# Patient Record
Sex: Male | Born: 1945 | Race: White | Hispanic: No | Marital: Married | Smoking: Former smoker
Health system: Southern US, Community
[De-identification: ages and names within clinical notes are randomized; demographics above are authoritative.]

## PROBLEM LIST (undated history)

## (undated) DIAGNOSIS — Z944 Liver transplant status: Secondary | ICD-10-CM

## (undated) DIAGNOSIS — E119 Type 2 diabetes mellitus without complications: Secondary | ICD-10-CM

## (undated) DIAGNOSIS — I10 Essential (primary) hypertension: Secondary | ICD-10-CM

## (undated) DIAGNOSIS — J449 Chronic obstructive pulmonary disease, unspecified: Secondary | ICD-10-CM

## (undated) HISTORY — PX: LIVER TRANSPLANT: SHX410

## (undated) HISTORY — PX: HERNIA REPAIR: SHX51

---

## 2005-02-25 ENCOUNTER — Emergency Department: Payer: Self-pay | Admitting: Emergency Medicine

## 2008-09-14 ENCOUNTER — Emergency Department: Payer: Self-pay | Admitting: Emergency Medicine

## 2008-09-24 ENCOUNTER — Emergency Department: Payer: Self-pay | Admitting: Emergency Medicine

## 2010-02-22 ENCOUNTER — Ambulatory Visit: Payer: Self-pay | Admitting: Internal Medicine

## 2011-12-01 ENCOUNTER — Ambulatory Visit: Payer: Self-pay | Admitting: Internal Medicine

## 2013-09-03 ENCOUNTER — Ambulatory Visit: Payer: Self-pay | Admitting: Internal Medicine

## 2017-08-29 ENCOUNTER — Ambulatory Visit
Admission: RE | Admit: 2017-08-29 | Discharge: 2017-08-29 | Disposition: A | Discharge status: Home / Self Care | Payer: Medicare Other | Source: Ambulatory Visit | Attending: Internal Medicine | Admitting: Internal Medicine

## 2017-08-29 ENCOUNTER — Other Ambulatory Visit: Payer: Self-pay | Admitting: Internal Medicine

## 2017-08-29 DIAGNOSIS — M5489 Other dorsalgia: Secondary | ICD-10-CM

## 2017-08-29 DIAGNOSIS — M47816 Spondylosis without myelopathy or radiculopathy, lumbar region: Secondary | ICD-10-CM | POA: Insufficient documentation

## 2018-06-15 ENCOUNTER — Encounter: Payer: Self-pay | Admitting: Emergency Medicine

## 2018-06-15 ENCOUNTER — Other Ambulatory Visit: Payer: Self-pay

## 2018-06-15 ENCOUNTER — Emergency Department: Payer: Medicare Other

## 2018-06-15 ENCOUNTER — Emergency Department
Admission: EM | Admit: 2018-06-15 | Discharge: 2018-06-16 | Disposition: A | Discharge status: Home / Self Care | Payer: Medicare Other | Attending: Emergency Medicine | Admitting: Emergency Medicine

## 2018-06-15 DIAGNOSIS — S6992XA Unspecified injury of left wrist, hand and finger(s), initial encounter: Secondary | ICD-10-CM | POA: Diagnosis present

## 2018-06-15 DIAGNOSIS — Y999 Unspecified external cause status: Secondary | ICD-10-CM | POA: Insufficient documentation

## 2018-06-15 DIAGNOSIS — I10 Essential (primary) hypertension: Secondary | ICD-10-CM | POA: Diagnosis not present

## 2018-06-15 DIAGNOSIS — Y929 Unspecified place or not applicable: Secondary | ICD-10-CM | POA: Insufficient documentation

## 2018-06-15 DIAGNOSIS — R51 Headache: Secondary | ICD-10-CM | POA: Diagnosis not present

## 2018-06-15 DIAGNOSIS — W1789XA Other fall from one level to another, initial encounter: Secondary | ICD-10-CM | POA: Insufficient documentation

## 2018-06-15 DIAGNOSIS — S0990XA Unspecified injury of head, initial encounter: Secondary | ICD-10-CM

## 2018-06-15 DIAGNOSIS — E119 Type 2 diabetes mellitus without complications: Secondary | ICD-10-CM | POA: Diagnosis not present

## 2018-06-15 DIAGNOSIS — J449 Chronic obstructive pulmonary disease, unspecified: Secondary | ICD-10-CM | POA: Diagnosis not present

## 2018-06-15 DIAGNOSIS — Z87891 Personal history of nicotine dependence: Secondary | ICD-10-CM | POA: Diagnosis not present

## 2018-06-15 DIAGNOSIS — S52552A Other extraarticular fracture of lower end of left radius, initial encounter for closed fracture: Secondary | ICD-10-CM | POA: Diagnosis not present

## 2018-06-15 DIAGNOSIS — Y939 Activity, unspecified: Secondary | ICD-10-CM | POA: Diagnosis not present

## 2018-06-15 DIAGNOSIS — Z7901 Long term (current) use of anticoagulants: Secondary | ICD-10-CM | POA: Insufficient documentation

## 2018-06-15 HISTORY — DX: Chronic obstructive pulmonary disease, unspecified: J44.9

## 2018-06-15 HISTORY — DX: Essential (primary) hypertension: I10

## 2018-06-15 HISTORY — DX: Liver transplant status: Z94.4

## 2018-06-15 HISTORY — DX: Type 2 diabetes mellitus without complications: E11.9

## 2018-06-15 MED ORDER — METOCLOPRAMIDE HCL 10 MG PO TABS
10.0000 mg | ORAL_TABLET | Freq: Once | ORAL | Status: AC
  Administered 2018-06-15: 10 mg via ORAL
  Filled 2018-06-15: qty 1

## 2018-06-15 MED ORDER — OXYCODONE-ACETAMINOPHEN 5-325 MG PO TABS
1.0000 | ORAL_TABLET | Freq: Once | ORAL | Status: AC
  Administered 2018-06-15: 1 via ORAL
  Filled 2018-06-15: qty 1

## 2018-06-15 NOTE — ED Triage Notes (Signed)
Pt arrived after mechanical fall that caused pt to hit his head. Pt takes coumadin. Pt denies loc. Pt reports tailbone pain and headache.

## 2018-06-15 NOTE — ED Provider Notes (Signed)
[facility] Emergency Department Provider Note ____________________________________________   First MD Initiated Contact with Patient 06/15/18 2121     (approximate)  I have reviewed the triage vital signs and the nursing notes.   HISTORY  Chief Complaint Fall    HPI Joshua Marshall is a 72 y.o. male with PMH as noted below and who is on Coumadin presents with a head injury, acute onset shortly before coming to the hospital when he tripped and fell from standing height, not associated with LOC.  Patient does report some nausea and headache.  He also reports some neck pain, pain to the tailbone, and to the left wrist and hand.   Past Medical History:  Diagnosis Date  . COPD (chronic obstructive pulmonary disease) (HCC)   . Diabetes mellitus without complication (HCC)   . Hypertension   . Liver transplanted (HCC)     There are no active problems to display for this patient.   Past Surgical History:  Procedure Laterality Date  . LIVER TRANSPLANT      Prior to Admission medications   Not on File    Allergies Patient has no allergy information on record.  No family history on file.  Social History Social History   Tobacco Use  . Smoking status: Former Games developer  . Smokeless tobacco: Never Used  Substance Use Topics  . Alcohol use: Not on file  . Drug use: Not on file    Review of Systems  Constitutional: No weakness. Eyes: No eye injury. ENT: Positive for neck pain. Cardiovascular: Denies chest pain. Respiratory: Denies shortness of breath. Gastrointestinal: Positive for nausea.  Genitourinary: Negative for flank pain.  Musculoskeletal: Negative for back pain.  Positive for sacral and left wrist pain. Skin: Negative for rash. Neurological: Positive for headache.  Negative for focal weakness or numbness.   ____________________________________________   PHYSICAL EXAM:  VITAL SIGNS: ED Triage Vitals  Enc Vitals Group   BP 06/15/18 2105 132/77     Pulse Rate 06/15/18 2105 62     Resp 06/15/18 2105 18     Temp 06/15/18 2105 97.8 F (36.6 C)     Temp Source 06/15/18 2105 Oral     SpO2 06/15/18 2105 96 %     Weight 06/15/18 2106 227 lb 8 oz (103.2 kg)     Height 06/15/18 2106 5' 10.5" (1.791 m)     Head Circumference --      Peak Flow --      Pain Score 06/15/18 2106 8     Pain Loc --      Pain Edu? --      Excl. in GC? --     Constitutional: Alert and oriented.  Slightly uncomfortable but otherwise relatively well-appearing. Eyes: Conjunctivae are normal.  EOMI.  PERRLA. Head: Atraumatic. Nose: No congestion/rhinnorhea. Mouth/Throat: Mucous membranes are moist.   Neck: Normal range of motion.  Mild midline tenderness with no step-off or crepitus. Cardiovascular:  Good peripheral circulation. Respiratory: Normal respiratory effort.  Gastrointestinal: Soft and nontender. No distention.  Genitourinary: No flank tenderness. Musculoskeletal: Extremities warm and well perfused.  Left wrist tenderness with no deformity.  Intact distal pulses to all extremities.  No thoracic or lumbar spinal tenderness.  Mild coccygeal tenderness. Neurologic:  Normal speech and language.  Motor and sensory intact in all extremities.  Normal coordination.  No gross focal neurologic deficits are appreciated.  Skin:  Skin is warm and dry. No rash noted. Psychiatric: Mood and affect are normal.  Speech and behavior are normal.  ____________________________________________   LABS (all labs ordered are listed, but only abnormal results are displayed)  Labs Reviewed - No data to display ____________________________________________  EKG   ____________________________________________  RADIOLOGY  CT head: No ICH CT cervical spine: No acute fracture XR sacrum/coccyx: No acute fracture XR L hand: No acute fracture XR L wrist: Impacted distal radius fracture with no significant angulation or  displacement  ____________________________________________   PROCEDURES  Procedure(s) performed: No  Procedures  Critical Care performed: No ____________________________________________   INITIAL IMPRESSION / ASSESSMENT AND PLAN / ED COURSE  Pertinent labs & imaging results that were available during my care of the patient were reviewed by me and considered in my medical decision making (see chart for details).  72 year old male on Coumadin presents with head injury as well as left wrist and hand and coccyx area pain after a fall from standing height which was mechanical.  On exam the patient is uncomfortable but relatively well-appearing.  His vital signs are normal.  The remainder of the exam is as described above.  He has no lumbar or thoracic spinal tenderness, no chest or abdominal tenderness, and his neuro exam is nonfocal.  We will obtain imaging of the relevant areas and reassess  ----------------------------------------- 12:11 AM on 06/16/2018 -----------------------------------------  All the imaging is negative except for impacted left distal radius fracture with no significant angulation or displacement.  I consulted Dr. Rosita Kea from orthopedics over the phone who recommended splinting and he would advise the patient to follow-up next week.  I discussed the results of the work-up with the patient.  Will place a splint on the left wrist.  I counseled him on the other imaging.  I will prescribe pain medication and some antiemetic.  Return precautions given, and he and his wife expressed understanding.  ____________________________________________   FINAL CLINICAL IMPRESSION(S) / ED DIAGNOSES  Final diagnoses:  Other closed extra-articular fracture of distal end of left radius, initial encounter  Minor head injury, initial encounter      NEW MEDICATIONS STARTED DURING THIS VISIT:  New Prescriptions   No medications on file     Note:  This document was  prepared using Dragon voice recognition software and may include unintentional dictation errors.    Dionne Bucy, MD 06/16/18 [phone]

## 2018-06-16 MED ORDER — OXYCODONE-ACETAMINOPHEN 5-325 MG PO TABS: 1.0000 | ORAL_TABLET | Freq: Four times a day (QID) | ORAL | 0 refills | Status: AC | PRN

## 2018-06-16 MED ORDER — METOCLOPRAMIDE HCL 10 MG PO TABS: 10.0000 mg | ORAL_TABLET | Freq: Three times a day (TID) | ORAL | 0 refills | Status: AC | PRN

## 2018-06-16 NOTE — Discharge Instructions (Signed)
Call Dr. Neomia Glass office or your own hand surgeon on Monday to schedule an appointment within the next week.  Return to the ER for new, worsening, persistent severe pain, weakness or numbness, severe headaches, vomiting, or any other new or worsening symptoms that concern you.

## 2018-06-16 NOTE — ED Notes (Signed)
E signature pad not working in room . Pt verbalized understanding of DC.  

## 2018-08-13 ENCOUNTER — Ambulatory Visit: Payer: Medicare Other | Attending: Orthopedic Surgery | Admitting: Occupational Therapy

## 2018-08-13 ENCOUNTER — Other Ambulatory Visit: Payer: Self-pay

## 2018-08-13 DIAGNOSIS — M6281 Muscle weakness (generalized): Secondary | ICD-10-CM | POA: Diagnosis not present

## 2018-08-13 NOTE — Patient Instructions (Signed)
Pt to use 16oz hammer for wrist in all planes  15 reps  2 x day  Increase to 2 sets in 3 days and 3 sets in 6 days - if not increase pain  But keep pain with HEP under 1-2/10   Putty green med - for gripping - 2 x 15 reps  and then in week add firm dark blue to green -and same reps and sets

## 2018-08-13 NOTE — Therapy (Signed)
[facility] [address] [place], [place], [address] Phone: [phone]   Fax:  [phone]  Occupational Therapy Treatment  Patient Details  Name: Joshua Marshall MRN: [id number] Date of Birth: 08/14/1946 Referring Provider (OT): patterson   Encounter Date: 08/13/2018  OT End of Session - 08/13/18 1358    Visit Number  1    Number of Visits  1    Date for OT Re-Evaluation  08/13/18    OT Start Time  1302    OT Stop Time  1352    OT Time Calculation (min)  50 min    Activity Tolerance  Patient tolerated treatment well    Behavior During Therapy  [facility] for tasks assessed/performed       Past Medical History:  Diagnosis Date  . COPD (chronic obstructive pulmonary disease) (HCC)   . Diabetes mellitus without complication (HCC)   . Hypertension   . Liver transplanted [facility](HCC)     Past Surgical History:  Procedure Laterality Date  . LIVER TRANSPLANT      There were no vitals filed for this visit.  Subjective Assessment - 08/13/18 1355    Subjective   I fell and hit my head , tailbone and wrist - but I am doing great - had liver transplant and this is just a bump in the road    Patient Stated Goals  To see what you find and what I can do at home for my wrist    Currently in Pain?  Yes    Pain Score  3     Pain Location  Wrist    Pain Orientation  Left    Pain Descriptors / Indicators  Tightness;Aching    Pain Type  Acute pain    Pain Onset  1 to 4 weeks ago         [facility] - 08/13/18 0001      Assessment   Medical Diagnosis  L distal radius fx    Referring Provider (OT)  patterson    Onset Date/Surgical Date  06/15/18    Hand Dominance  Right    Next MD Visit  --   jan 24     Home  Environment   Lives With  Spouse      Prior Function   Vocation  Retired    Leisure  maintain yard, platoon boat, work in my shop      AROM   Right Wrist Extension  70 Degrees    Right Wrist Flexion   84 Degrees    Right Wrist Radial Deviation  18 Degrees    Right Wrist Ulnar Deviation  34 Degrees    Left Wrist Extension  70 Degrees    Left Wrist Flexion  84 Degrees    Left Wrist Radial Deviation  32 Degrees    Left Wrist Ulnar Deviation  28 Degrees      Strength   Right Hand Grip (lbs)  75    Right Hand Lateral Pinch  13 lbs    Right Hand 3 Point Pinch  13 lbs    Left Hand Grip (lbs)  46    Left Hand Lateral Pinch  15 lbs    Left Hand 3 Point Pinch  12 lbs       HEP review with pt    Pt to use 16oz hammer for wrist in all planes  15 reps  2 x day  Increase to 2  sets in 3 days and 3 sets in 6 days - if not increase pain  But keep pain with HEP under 1-2/10   Putty green med - for gripping - 2 x 15 reps  and then in week add firm dark blue to green -and same reps and sets                OT Education - 08/13/18 1357    Education Details  findings and HEP    Person(s) Educated  Patient    Methods  Explanation;Demonstration;Handout    Comprehension  Verbalized understanding;Returned demonstration          OT Long Term Goals - 08/13/18 1401      OT LONG TERM GOAL #1   Title  Pt to demo understanding of HEP for grip strength and wrist strength     Status  Achieved            Plan - 08/13/18 1358    Clinical Impression Statement  Pt present at OT eval 8 wks out from distal radius fx on the L - pt report wearing splint less than 10% of time -and using it in most all act - except some pain with  pushing out of bed or chair - AROM compare to R wrist WNL - and  strength at wrist-  grip is decrease - did provide pt with HEP for wrist and grip strength - pt wants prefer doing it at home for 2 -4 wks until follow up with MD - reinforce with pt that he is only 8 wks  our from Fx -and still keep pain under 1-2/10 the next 4 wks while gaining more strength     OT Frequency  One time visit    Plan  discharge with HEP     Clinical Decision Making  Limited  treatment options, no task modification necessary    OT Home Exercise Plan  see pt instrucition    Consulted and Agree with Plan of Care  Patient       Patient will benefit from skilled therapeutic intervention in order to improve the following deficits and impairments:     Visit Diagnosis: Muscle weakness (generalized) - Plan: Ot plan of care cert/re-cert    Problem List There are no active problems to display for this patient.   Oletta CohnuPreez, Messi Twedt OTR/L,CLT 08/13/2018, 3:00 PM  [facility] [address] [place], [place], [address] Phone: [phone]   Fax:  [phone]  Name: Joshua Marshall MRN: [id number] Date of Birth: 01/24/1946

## 2019-05-24 ENCOUNTER — Other Ambulatory Visit: Payer: Self-pay

## 2019-05-24 DIAGNOSIS — Z20822 Contact with and (suspected) exposure to covid-19: Secondary | ICD-10-CM

## 2019-05-26 LAB — NOVEL CORONAVIRUS, NAA: SARS-CoV-2, NAA: NOT DETECTED

## 2020-08-14 ENCOUNTER — Emergency Department: Payer: Medicare PPO

## 2020-08-14 ENCOUNTER — Other Ambulatory Visit: Payer: Self-pay

## 2020-08-14 ENCOUNTER — Emergency Department
Admission: EM | Admit: 2020-08-14 | Discharge: 2020-08-15 | Disposition: A | Discharge status: Home / Self Care | Payer: Medicare PPO | Attending: Emergency Medicine | Admitting: Emergency Medicine

## 2020-08-14 DIAGNOSIS — H8110 Benign paroxysmal vertigo, unspecified ear: Secondary | ICD-10-CM | POA: Diagnosis not present

## 2020-08-14 DIAGNOSIS — Z87891 Personal history of nicotine dependence: Secondary | ICD-10-CM | POA: Diagnosis not present

## 2020-08-14 DIAGNOSIS — R42 Dizziness and giddiness: Secondary | ICD-10-CM | POA: Diagnosis present

## 2020-08-14 DIAGNOSIS — I10 Essential (primary) hypertension: Secondary | ICD-10-CM | POA: Diagnosis not present

## 2020-08-14 DIAGNOSIS — J449 Chronic obstructive pulmonary disease, unspecified: Secondary | ICD-10-CM | POA: Diagnosis not present

## 2020-08-14 DIAGNOSIS — E119 Type 2 diabetes mellitus without complications: Secondary | ICD-10-CM | POA: Insufficient documentation

## 2020-08-14 LAB — BASIC METABOLIC PANEL
Anion gap: 12 (ref 5–15)
BUN: 27 mg/dL — ABNORMAL HIGH (ref 8–23)
CO2: 21 mmol/L — ABNORMAL LOW (ref 22–32)
Calcium: 8.8 mg/dL — ABNORMAL LOW (ref 8.9–10.3)
Chloride: 106 mmol/L (ref 98–111)
Creatinine, Ser: 1.56 mg/dL — ABNORMAL HIGH (ref 0.61–1.24)
GFR, Estimated: 46 mL/min — ABNORMAL LOW (ref >60)
Glucose, Bld: 162 mg/dL — ABNORMAL HIGH (ref 70–99)
Potassium: 4.1 mmol/L (ref 3.5–5.1)
Sodium: 139 mmol/L (ref 135–145)

## 2020-08-14 LAB — PROTIME-INR
INR: 2.3 — ABNORMAL HIGH (ref 0.8–1.2)
Prothrombin Time: 24.4 seconds — ABNORMAL HIGH (ref 11.4–15.2)

## 2020-08-14 LAB — CBC
HCT: 38.2 % — ABNORMAL LOW (ref 39.0–52.0)
Hemoglobin: 13.1 g/dL (ref 13.0–17.0)
MCH: 27.8 pg (ref 26.0–34.0)
MCHC: 34.3 g/dL (ref 30.0–36.0)
MCV: 81.1 fL (ref 80.0–100.0)
Platelets: 210 10*3/uL (ref 150–400)
RBC: 4.71 MIL/uL (ref 4.22–5.81)
RDW: 15.9 % — ABNORMAL HIGH (ref 11.5–15.5)
WBC: 10.2 10*3/uL (ref 4.0–10.5)
nRBC: 0 % (ref 0.0–0.2)

## 2020-08-14 LAB — HEPATIC FUNCTION PANEL
ALT: 42 U/L (ref 0–44)
AST: 36 U/L (ref 15–41)
Albumin: 3.7 g/dL (ref 3.5–5.0)
Alkaline Phosphatase: 131 U/L — ABNORMAL HIGH (ref 38–126)
Bilirubin, Direct: <0.1 mg/dL (ref 0.0–0.2)
Total Bilirubin: 0.5 mg/dL (ref 0.3–1.2)
Total Protein: 6.5 g/dL (ref 6.5–8.1)

## 2020-08-14 LAB — TROPONIN I (HIGH SENSITIVITY): Troponin I (High Sensitivity): 6 ng/L (ref <18)

## 2020-08-14 MED ORDER — MECLIZINE HCL 25 MG PO TABS
25.0000 mg | ORAL_TABLET | Freq: Once | ORAL | Status: AC
  Administered 2020-08-14: 25 mg via ORAL
  Filled 2020-08-14: qty 1

## 2020-08-14 MED ORDER — SODIUM CHLORIDE 0.9 % IV BOLUS
500.0000 mL | Freq: Once | INTRAVENOUS | Status: AC
  Administered 2020-08-14: 500 mL via INTRAVENOUS

## 2020-08-14 MED ORDER — LORAZEPAM 2 MG/ML IJ SOLN
1.0000 mg | Freq: Once | INTRAMUSCULAR | Status: AC
  Administered 2020-08-14: 1 mg via INTRAVENOUS
  Filled 2020-08-14: qty 1

## 2020-08-14 MED ORDER — ONDANSETRON HCL 4 MG/2ML IJ SOLN
4.0000 mg | Freq: Once | INTRAMUSCULAR | Status: AC
  Administered 2020-08-14: 4 mg via INTRAVENOUS
  Filled 2020-08-14: qty 2

## 2020-08-14 NOTE — ED Triage Notes (Signed)
Pt with sudden onset of dizziness, N/V and chest pain. Pt pale and actively vomiting in triage.

## 2020-08-14 NOTE — ED Provider Notes (Addendum)
[facility] Emergency Department Provider Note  ____________________________________________   Event Date/Time   First MD Initiated Contact with Patient 08/14/20 2053     (approximate)  I have reviewed the triage vital signs and the nursing notes.   HISTORY  Chief Complaint Emesis and Chest Pain    HPI Joshua Marshall is a 74 y.o. male  With h/o COPD, DM, HTN, liver transplant, here with nausea, dizziness. Pt reports he was at dinner this evening, initially felt fine. However, he developed acute onset of severe dizziness, sensation of the room moving/spinning. He then tried to stand up to go outside and had nausea, vomiting. He's had multiple episodes of emesis since then. He denies any abd pain. He felt fine prior to eating. No diarrhea. No headache. No focal numbness, weakness. Denies any difficulty speaking or swallowing. He does state that he had vertigo once before. No recent changes in his transplant meds. Sx worse with movement, opening eyes. No alleviating factors. No tinnitus or recent ear pain, sinusitis.        Past Medical History:  Diagnosis Date  . COPD (chronic obstructive pulmonary disease) (HCC)   . Diabetes mellitus without complication (HCC)   . Hypertension   . Liver transplanted (HCC)     There are no problems to display for this patient.   Past Surgical History:  Procedure Laterality Date  . LIVER TRANSPLANT      Prior to Admission medications   Medication Sig Start Date End Date Taking? Authorizing Provider  meclizine (ANTIVERT) 25 MG tablet Take 1 tablet (25 mg total) by mouth 3 (three) times daily as needed for dizziness. 08/15/20  Yes Delton Prairie, MD  metoCLOPramide (REGLAN) 10 MG tablet Take 1 tablet (10 mg total) by mouth every 8 (eight) hours as needed for up to 5 days for nausea or vomiting. 06/16/18 06/21/18  Dionne Bucy, MD    Allergies Patient has no known allergies.  History reviewed. No pertinent  family history.  Social History Social History   Tobacco Use  . Smoking status: Former Games developer  . Smokeless tobacco: Never Used    Review of Systems  Review of Systems  Constitutional: Positive for fatigue. Negative for chills and fever.  HENT: Negative for sore throat.   Respiratory: Negative for shortness of breath.   Cardiovascular: Negative for chest pain.  Gastrointestinal: Positive for nausea and vomiting. Negative for abdominal pain.  Genitourinary: Negative for flank pain.  Musculoskeletal: Negative for neck pain.  Skin: Negative for rash and wound.  Allergic/Immunologic: Negative for immunocompromised state.  Neurological: Positive for dizziness. Negative for weakness and numbness.  Hematological: Does not bruise/bleed easily.  All other systems reviewed and are negative.    ____________________________________________  PHYSICAL EXAM:      VITAL SIGNS: ED Triage Vitals [08/14/20 2000]  Enc Vitals Group     BP (!) 166/81     Pulse Rate (!) 50     Resp (!) 25     Temp 97.7 F (36.5 C)     Temp Source Oral     SpO2 100 %     Weight      Height      Head Circumference      Peak Flow      Pain Score      Pain Loc      Pain Edu?      Excl. in GC?      Physical Exam Vitals and nursing note reviewed.  Constitutional:      General: He is not in acute distress.    Appearance: He is well-developed. He is diaphoretic.  HENT:     Head: Normocephalic and atraumatic.  Eyes:     Conjunctiva/sclera: Conjunctivae normal.  Cardiovascular:     Rate and Rhythm: Normal rate and regular rhythm.     Heart sounds: Normal heart sounds. No murmur heard. No friction rub.  Pulmonary:     Effort: Pulmonary effort is normal. No respiratory distress.     Breath sounds: Normal breath sounds. No wheezing or rales.  Abdominal:     General: There is no distension.     Palpations: Abdomen is soft.     Tenderness: There is no abdominal tenderness.  Musculoskeletal:      Cervical back: Neck supple.  Skin:    General: Skin is warm.     Capillary Refill: Capillary refill takes less than 2 seconds.  Neurological:     Mental Status: He is alert and oriented to person, place, and time.     Motor: No abnormal muscle tone.     Comments: Unilateral horizontal nystagmus noted. CN otherwise intact.  Palate elevates symmetrically.  No dysphagia or dysarthria.  Strength 5 and 5 bilateral upper and lower extremities.  Normal sensation light touch.  Unable to assess finger-to-nose due to nausea and dizziness.  Unable to assess gait.       ____________________________________________   LABS (all labs ordered are listed, but only abnormal results are displayed)  Labs Reviewed  BASIC METABOLIC PANEL - Abnormal; Notable for the following components:      Result Value   CO2 21 (*)    Glucose, Bld 162 (*)    BUN 27 (*)    Creatinine, Ser 1.56 (*)    Calcium 8.8 (*)    GFR, Estimated 46 (*)    All other components within normal limits  CBC - Abnormal; Notable for the following components:   HCT 38.2 (*)    RDW 15.9 (*)    All other components within normal limits  PROTIME-INR - Abnormal; Notable for the following components:   Prothrombin Time 24.4 (*)    INR 2.3 (*)    All other components within normal limits  HEPATIC FUNCTION PANEL - Abnormal; Notable for the following components:   Alkaline Phosphatase 131 (*)    All other components within normal limits  TROPONIN I (HIGH SENSITIVITY)  TROPONIN I (HIGH SENSITIVITY)    ____________________________________________  EKG: Sinus bradycardia, ventricular rate 54.  QRS 123, QTc 424.  PVCs.  No significant change from prior.  No ischemia or infarct. ________________________________________  RADIOLOGY All imaging, including plain films, CT scans, and ultrasounds, independently reviewed by me, and interpretations confirmed via formal radiology reads.  ED MD interpretation:   CT head: No acute  abnormality Chest x-ray: Clear  Official radiology report(s): CT Head Wo Contrast  Result Date: 08/14/2020 CLINICAL DATA:  Neuro deficit, acute, stroke suspected Dizziness with nausea and vomiting. EXAM: CT HEAD WITHOUT CONTRAST TECHNIQUE: Contiguous axial images were obtained from the base of the skull through the vertex without intravenous contrast. COMPARISON:  Head CT 06/16/2018 FINDINGS: Brain: Brain volume is normal for age. No intracranial hemorrhage, mass effect, or midline shift. No hydrocephalus. The basilar cisterns are patent. No evidence of territorial infarct or acute ischemia. No extra-axial or intracranial fluid collection. Vascular: No hyperdense vessel or unexpected calcification. Skull: Normal. Negative for fracture or focal lesion. Sinuses/Orbits: Opacification of lower left mastoid  air cells. Right mastoid air cells are clear. The paranasal sinuses are well aerated with occasional mucosal thickening. Orbits are unremarkable. Other: None. IMPRESSION: 1. No acute intracranial abnormality. 2. Opacification of lower left mastoid air cells. Electronically Signed   By: Narda Rutherford M.D.   On: 08/14/2020 21:31   MR BRAIN WO CONTRAST  Result Date: 08/15/2020 CLINICAL DATA:  Dizziness EXAM: MRI HEAD WITHOUT CONTRAST TECHNIQUE: Multiplanar, multiecho pulse sequences of the brain and surrounding structures were obtained without intravenous contrast. COMPARISON:  None. FINDINGS: Brain: No acute infarct, mass effect or extra-axial collection. No acute or chronic hemorrhage. Normal white matter signal, parenchymal volume and CSF spaces. The midline structures are normal. Vascular: Major flow voids are preserved. Skull and upper cervical spine: Normal calvarium and skull base. Visualized upper cervical spine and soft tissues are normal. Sinuses/Orbits:No paranasal sinus fluid levels or advanced mucosal thickening. No mastoid or middle ear effusion. Normal orbits. IMPRESSION: Normal brain MRI.  Electronically Signed   By: Deatra Robinson M.D.   On: 08/15/2020 00:45   DG Chest Portable 1 View  Result Date: 08/14/2020 CLINICAL DATA:  Dizziness, nausea and vomiting, chest pain EXAM: PORTABLE CHEST 1 VIEW COMPARISON:  08/29/2017 FINDINGS: Single frontal view of the chest demonstrates an unremarkable cardiac silhouette. No airspace disease, effusion, or pneumothorax. Multiple surgical clips and stents in the right upper quadrant likely related to previous liver transplant, stable. IMPRESSION: 1. No acute intrathoracic process. Electronically Signed   By: Sharlet Salina M.D.   On: 08/14/2020 21:39    ____________________________________________  PROCEDURES   Procedure(s) performed (including Critical Care):  Procedures  ____________________________________________  INITIAL IMPRESSION / MDM / ASSESSMENT AND PLAN / ED COURSE  As part of my medical decision making, I reviewed the following data within the electronic MEDICAL RECORD NUMBER Nursing notes reviewed and incorporated, Old chart reviewed, Notes from prior ED visits, and [place] Controlled Substance Database       *Joshua Marshall was evaluated in Emergency Department on 08/15/2020 for the symptoms described in the history of present illness. He was evaluated in the context of the global COVID-19 pandemic, which necessitated consideration that the patient might be at risk for infection with the SARS-CoV-2 virus that causes COVID-19. Institutional protocols and algorithms that pertain to the evaluation of patients at risk for COVID-19 are in a state of rapid change based on information released by regulatory bodies including the CDC and federal and state organizations. These policies and algorithms were followed during the patient's care in the ED.  Some ED evaluations and interventions may be delayed as a result of limited staffing during the pandemic.*  Clinical Course as of 08/15/20 0059  Sat Aug 15, 2020  0056 Normal MRI brain noted.   Reassessed the patient he reports mild generalized weakness, but resolution of his dizziness and vertigo.  I disconnected from the monitor and he stands independently, ambulates around the room and makes jokes with his wife.  They report they are ready to go.  We discussed return precautions for the ED. [DS]    Clinical Course User Index [DS] Delton Prairie, MD    Medical Decision Making: 74 year old male here with dizziness and generalized weakness.  Exam and history is consistent with suspected acute vertigo.  He has had episodes in the past, and I suspect this is peripheral given horizontal unidirectional nystagmus noted on examination with worsening with position changes.  That being said, given his age, will rule out central cause.  No other  signs of cerebellar dysfunction on exam.  EKG shows sinus bradycardia.  I reviewed his previous PCP visits and it seems that he is chronically bradycardic.  I see no evidence of ischemia or infarct and initial troponin is negative which is reassuring.  Will plan to repeat this, but this makes ACS or cardiac etiology less likely.  Otherwise, lab work reviewed and is largely unremarkable.  He feels better with Ativan and Zofran.  Will give meclizine, plan for MRI and repeat troponin.  If he feels better and imaging as well as troponin are negative, would be reasonable to treat him for peripheral vertigo with outpatient follow-up.  Of note, reviewed CT Head - pt has no TM erythema, no opacity, no mastoid tenderness or signs to suggest mastoiditis or AOM clinically. ____________________________________________  FINAL CLINICAL IMPRESSION(S) / ED DIAGNOSES  Final diagnoses:  Vertigo    MEDICATIONS GIVEN DURING THIS VISIT:  Medications  LORazepam (ATIVAN) injection 1 mg (1 mg Intravenous Given 08/14/20 2109)  ondansetron (ZOFRAN) injection 4 mg (4 mg Intravenous Given 08/14/20 2109)  sodium chloride 0.9 % bolus 500 mL (0 mLs Intravenous Stopped 08/14/20 2313)   meclizine (ANTIVERT) tablet 25 mg (25 mg Oral Given 08/14/20 2241)  LORazepam (ATIVAN) injection 1 mg (1 mg Intravenous Given 08/14/20 2326)     ED Discharge Orders         Ordered    meclizine (ANTIVERT) 25 MG tablet  3 times daily PRN        08/15/20 0058           Note:  This document was prepared using Dragon voice recognition software and may include unintentional dictation errors.   Shaune Pollack, MD 08/15/20 0100    Shaune Pollack, MD 08/15/20 0100

## 2020-08-14 NOTE — ED Notes (Signed)
Pt talking to MRI tech att, pt helped with urinal, EDP notified for anxiety for MRI  Pt reports dizziness is improved "just feel numb now"

## 2020-08-15 MED ORDER — MECLIZINE HCL 25 MG PO TABS: 25.0000 mg | ORAL_TABLET | Freq: Three times a day (TID) | ORAL | 0 refills | Status: AC | PRN

## 2020-08-15 NOTE — ED Notes (Signed)
Peripheral IV discontinued. Catheter intact. No signs of infiltration or redness. Gauze applied to IV site.   Discharge instructions reviewed with patient. Questions fielded by this RN. Patient verbalizes understanding of instructions. Patient discharged home in stable condition per [facility] . No acute distress noted at time of discharge.   Pt dressed in blue paper scrubs  Pt wheel to spouse's vehicle

## 2020-08-15 NOTE — Discharge Instructions (Signed)
You were seen in the ED because of your dizziness and vertigo.  We did an MRI that ruled out stroke as the cause.  You are being discharged with a prescription for meclizine to use up to 3 times daily as needed for any further dizziness.  Please follow-up with your PCP in the next week.  Return to the ED with any worsening symptoms not controlled with medication, dizziness with fevers or passing out

## 2020-08-15 NOTE — ED Provider Notes (Signed)
  Patient received in signout from Dr. Erma Heritage pending MRI brain for evaluation of new vertigo.  No evidence of central process.  Patient reports resolution of symptoms after meclizine and Ativan.  We discussed return precautions for the ED and provided prescription for meclizine as an outpatient.  Patient medically stable for discharge home.  Clinical Course as of 08/15/20 0100  Sat Aug 15, 2020  0056 Normal MRI brain noted.  Reassessed the patient he reports mild generalized weakness, but resolution of his dizziness and vertigo.  I disconnected from the monitor and he stands independently, ambulates around the room and makes jokes with his wife.  They report they are ready to go.  We discussed return precautions for the ED. [DS]    Clinical Course User Index [DS] Delton Prairie, MD      Delton Prairie, MD 08/15/20 0100

## 2022-09-01 IMAGING — CT CT HEAD W/O CM
4 series · 16 of 47 positions shown, 18 images · non-contrast
Comparison: Head CT 06/16/2018

CLINICAL DATA: Neuro deficit, acute, stroke suspected

Dizziness with nausea and vomiting.
EXAM:
CT HEAD WITHOUT CONTRAST
TECHNIQUE: Contiguous axial images were obtained from the base of the skull
through the vertex without intravenous contrast.

[Series 2: head wo · axial · 0.44mm/px · z∈[-116,-6]mm · 7 of 30 slices shown, 9 images]
[im 4/30  brain]
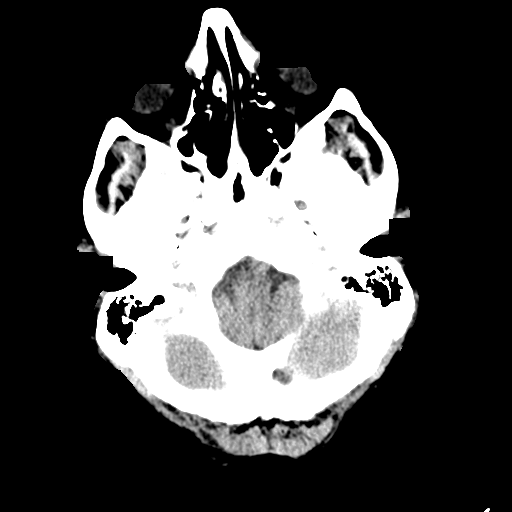
[im 4/30  bone]
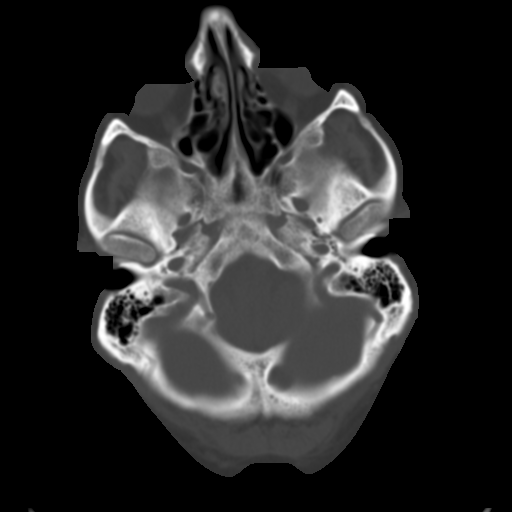
[im 8/30  brain]
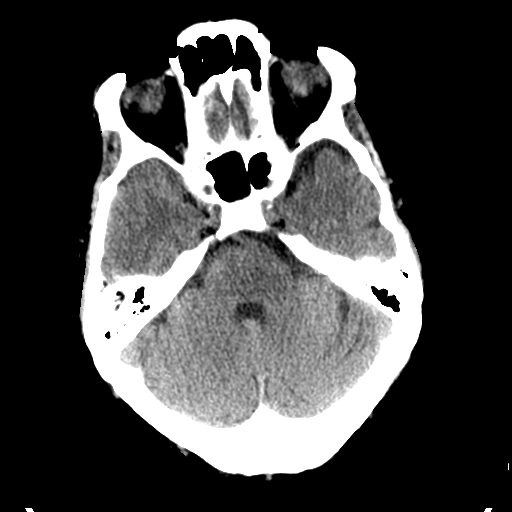
[im 11/30  brain]
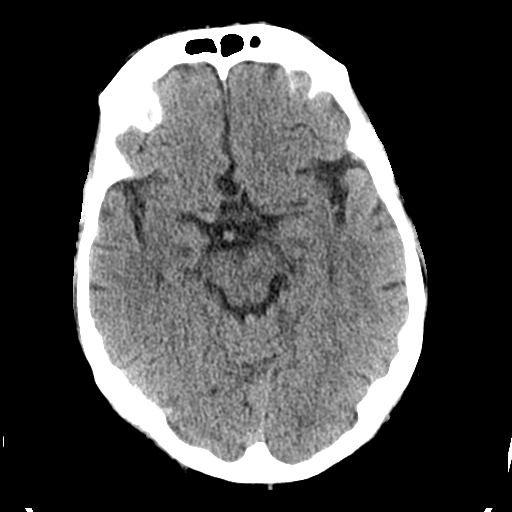
[im 15/30  brain]
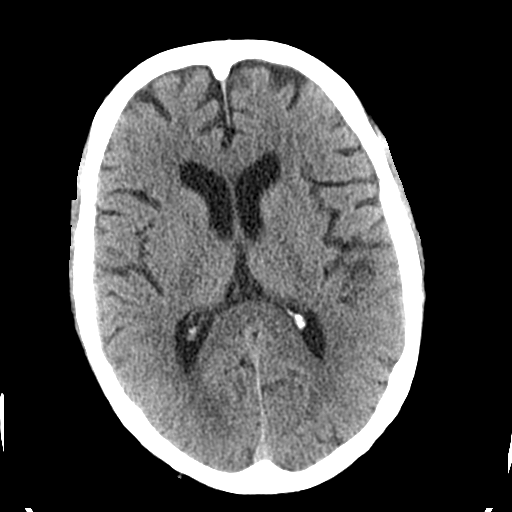
[im 19/30  brain]
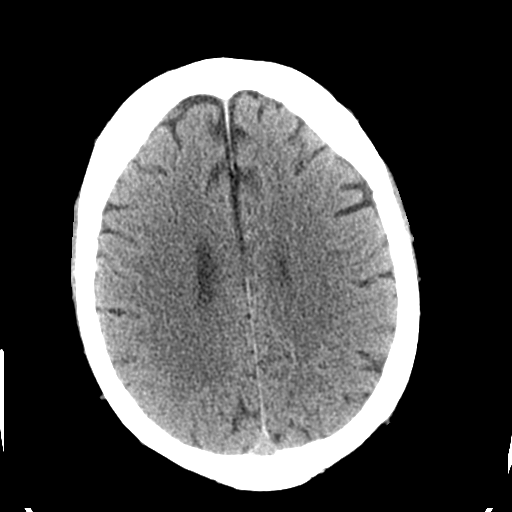
[im 19/30  bone]
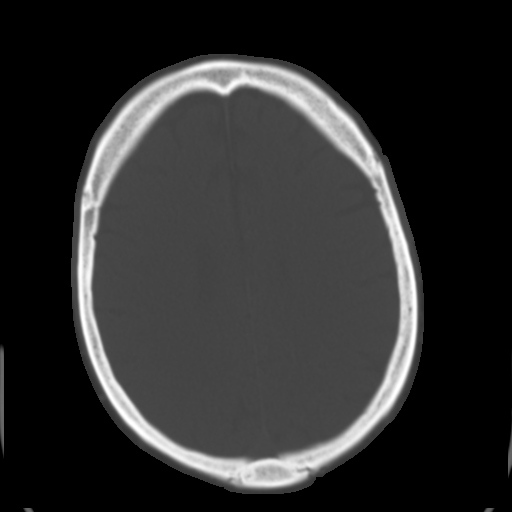
[im 22/30  brain]
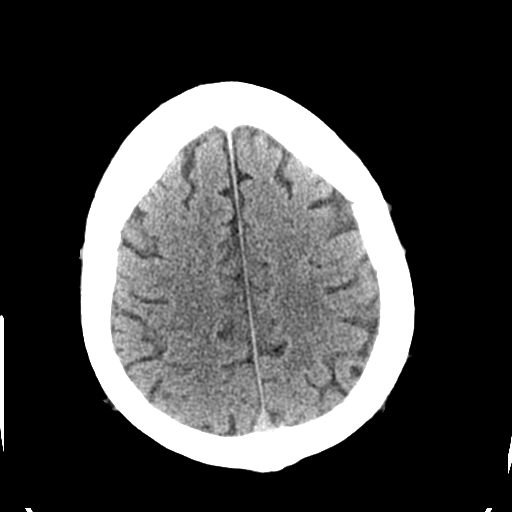
[im 26/30  brain]
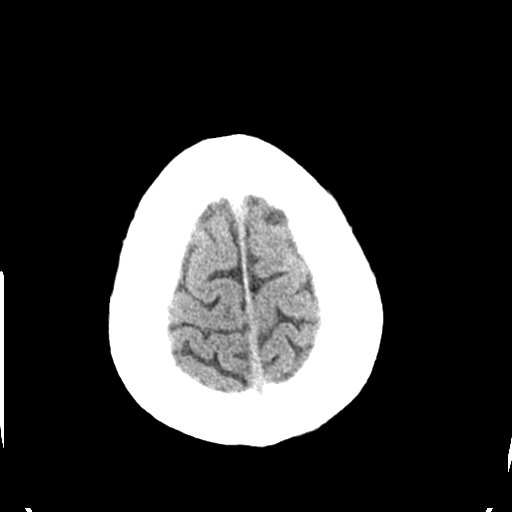

[Series 3: head bone · axial · 0.44mm/px · z∈[-117,-87]mm · 3 of 75 slices shown]
[im 8/75  bone]
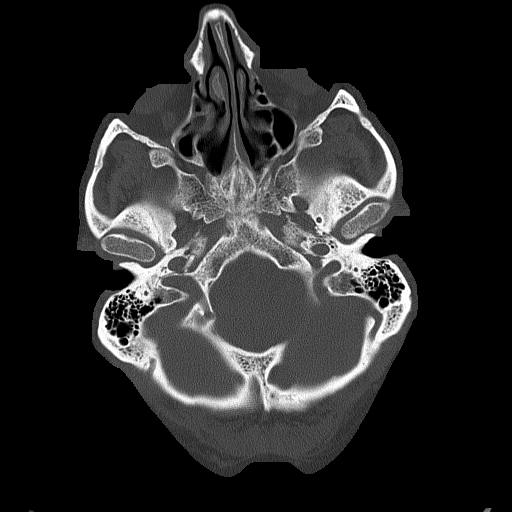
[im 15/75  bone]
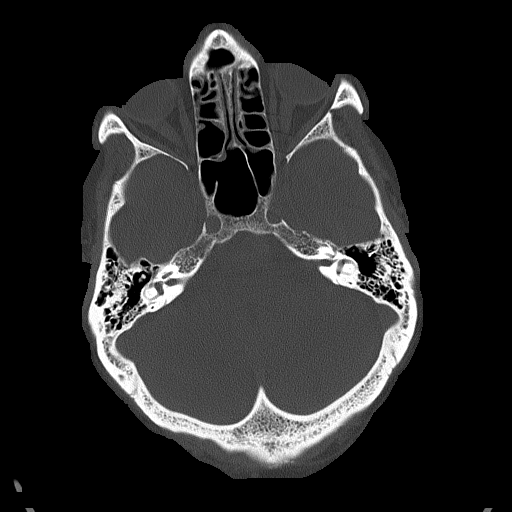
[im 23/75  bone]
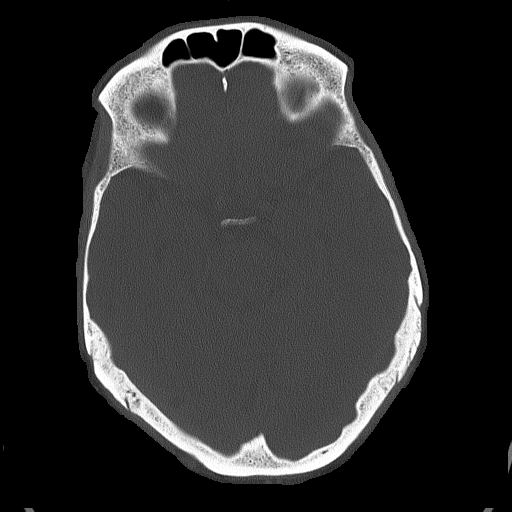

[Series 4: coronal soft tissue · coronal · 0.31mm/px · 3 of 75 slices shown]
[im 25/75  brain]
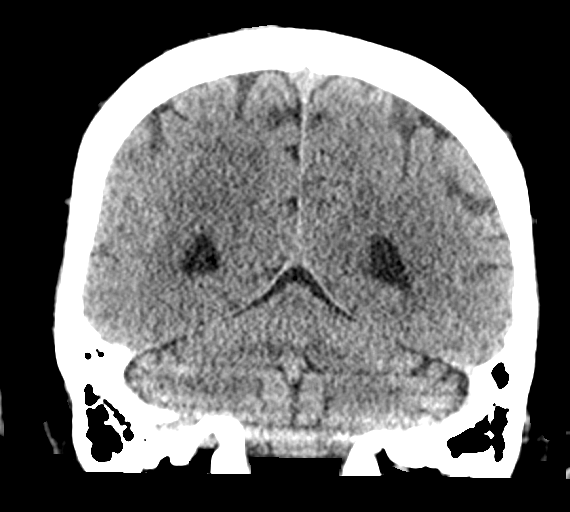
[im 33/75  brain]
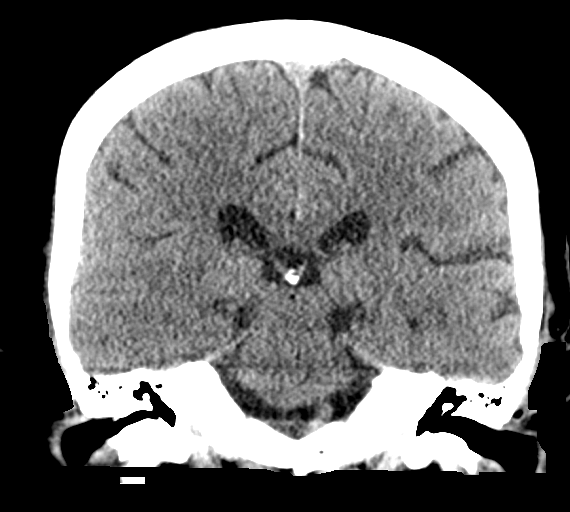
[im 42/75  brain]
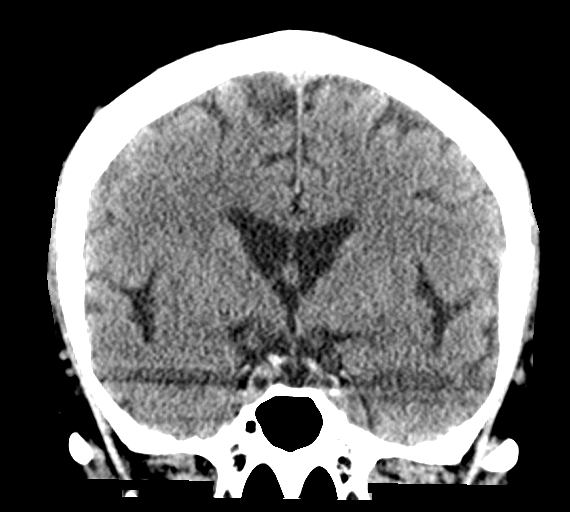

[Series 5: sagittal soft tissue · sagittal · 0.32mm/px · 3 of 64 slices shown]
[im 22/64  brain]
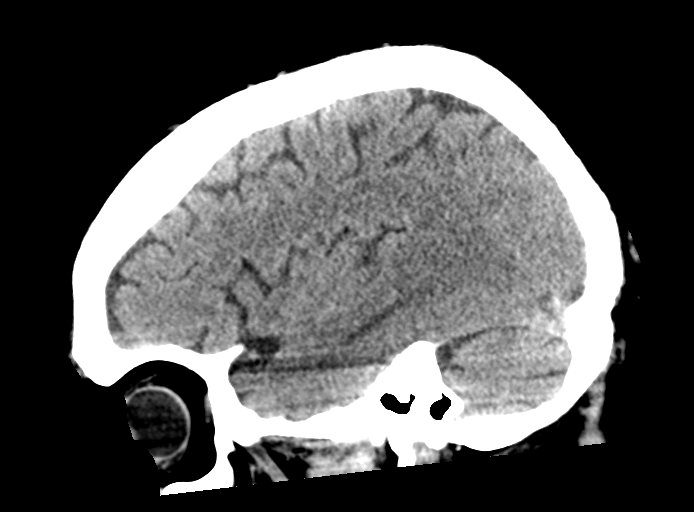
[im 32/64  brain]
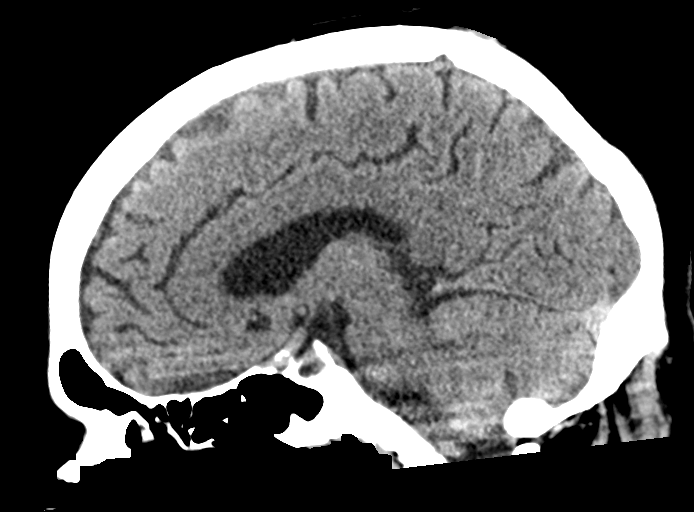
[im 43/64  brain]
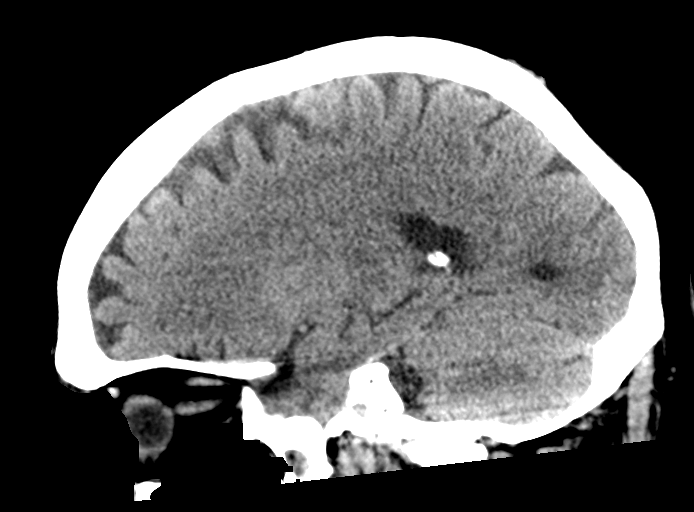

[16 of 47 positions shown; findings below may reference images not displayed]

FINDINGS: Brain: Brain volume is normal for age. No intracranial hemorrhage,
mass effect, or midline shift. No hydrocephalus. The basilar
cisterns are patent. No evidence of territorial infarct or acute
ischemia. No extra-axial or intracranial fluid collection.

Vascular: No hyperdense vessel or unexpected calcification.

Skull: Normal. Negative for fracture or focal lesion.

Sinuses/Orbits: Opacification of lower left mastoid air cells. Right
mastoid air cells are clear. The paranasal sinuses are well aerated
with occasional mucosal thickening. Orbits are unremarkable.

Other: None.
IMPRESSION: 1. No acute intracranial abnormality.
2. Opacification of lower left mastoid air cells.

## 2024-01-17 ENCOUNTER — Encounter (INDEPENDENT_AMBULATORY_CARE_PROVIDER_SITE_OTHER): Payer: Self-pay | Admitting: Vascular Surgery

## 2024-01-17 ENCOUNTER — Ambulatory Visit (INDEPENDENT_AMBULATORY_CARE_PROVIDER_SITE_OTHER): Payer: No Typology Code available for payment source | Admitting: Vascular Surgery

## 2024-01-17 VITALS — BP 120/75 | HR 64 | Resp 18 | Ht 71.5 in | Wt 239.0 lb

## 2024-01-17 DIAGNOSIS — I890 Lymphedema, not elsewhere classified: Secondary | ICD-10-CM | POA: Diagnosis not present

## 2024-01-17 DIAGNOSIS — I10 Essential (primary) hypertension: Secondary | ICD-10-CM | POA: Diagnosis not present

## 2024-01-17 DIAGNOSIS — E119 Type 2 diabetes mellitus without complications: Secondary | ICD-10-CM

## 2024-01-19 ENCOUNTER — Ambulatory Visit
Admission: EM | Admit: 2024-01-19 | Discharge: 2024-01-19 | Disposition: A | Discharge status: Home / Self Care | Attending: Physician Assistant | Admitting: Physician Assistant

## 2024-01-19 ENCOUNTER — Ambulatory Visit

## 2024-01-19 ENCOUNTER — Encounter: Payer: Self-pay | Admitting: Emergency Medicine

## 2024-01-19 DIAGNOSIS — M109 Gout, unspecified: Secondary | ICD-10-CM | POA: Diagnosis present

## 2024-01-19 DIAGNOSIS — I82502 Chronic embolism and thrombosis of unspecified deep veins of left lower extremity: Secondary | ICD-10-CM | POA: Diagnosis present

## 2024-01-19 DIAGNOSIS — M7989 Other specified soft tissue disorders: Secondary | ICD-10-CM

## 2024-01-19 LAB — URIC ACID: Uric Acid, Serum: 7.7 mg/dL (ref 3.7–8.6)

## 2024-01-19 MED ORDER — PREDNISONE 10 MG PO TABS: ORAL_TABLET | ORAL | 0 refills | Status: AC

## 2024-01-19 NOTE — ED Provider Notes (Signed)
 [facility]    CSN: [id number] Arrival date & time: 01/19/24  1423      History   Chief Complaint Chief Complaint  Patient presents with   Foot Pain    left    HPI Joshua Marshall is a 78 y.o. male with history of COPD, diabetes, hypertension, liver transplant x 2, prostate cancer, stage III CKD, long-term anticoagulant use, and DVT of left lower extremity.  He presents today for evaluation of left dorsal foot pain that started this morning.  He reports the pain is 10 out of 10.  He has noticed associated erythema and increased warmth of skin.  Denies any injuries or falls.  He did see his vascular provider 2 days ago but was not having active symptoms at that time.  He is scheduled to have bilateral ultrasound of lower extremities in about 1 month.  Patient is on Coumadin for management of DVT.  He says he has been on this medication for 20 years.  He reports he checks his INR monthly and it has been within the advised range. Has an IVC filter. He reports no known history of gout.  HPI  Past Medical History:  Diagnosis Date   COPD (chronic obstructive pulmonary disease) (HCC)    Diabetes mellitus without complication (HCC)    Hypertension    Liver transplanted (HCC)     There are no active problems to display for this patient.   Past Surgical History:  Procedure Laterality Date   HERNIA REPAIR     LIVER TRANSPLANT         Home Medications    Prior to Admission medications   Medication Sig Start Date End Date Taking? Authorizing Provider  predniSONE (DELTASONE) 10 MG tablet Take 6 tabs po on day 1 and decrease by 1 tab daily until complete 01/19/24  Yes Nancy Axon B, PA-C  amoxicillin-clavulanate (AUGMENTIN) 875-125 MG tablet Take 1 tablet by mouth 2 (two) times daily.    [provider]  atorvastatin (LIPITOR) 80 MG tablet Take 80 mg by mouth daily.    [provider]  Calcium Carbonate Antacid (CALCIUM CARBONATE PO) Take 750 mg by  mouth 3 (three) times daily.    [provider]  clonazePAM (KLONOPIN) 0.5 MG tablet Take 1 mg by mouth 2 (two) times daily.    [provider]  escitalopram (LEXAPRO) 20 MG tablet Take 20 mg by mouth daily.    [provider]  FARXIGA 10 MG TABS tablet Take 10 mg by mouth as directed. 1 tablet Mon-Sat 05/10/22   [provider]  fluticasone-salmeterol (ADVAIR) 250-50 MCG/ACT AEPB Inhale 1 puff into the lungs in the morning and at bedtime. 08/04/23 08/03/24  [provider]  furosemide (LASIX) 40 MG tablet Take 40 mg by mouth daily.    [provider]  GEMTESA 75 MG TABS Take by mouth daily. 10/18/23   [provider]  glipiZIDE (GLUCOTROL XL) 5 MG 24 hr tablet Take 5 mg by mouth daily with breakfast. 07/06/21   [provider]  lansoprazole (PREVACID) 30 MG capsule Take 30 mg by mouth daily.    [provider]  loratadine (CLARITIN) 10 MG tablet Take 10 mg by mouth daily. 06/30/08   [provider]  meclizine  (ANTIVERT ) 25 MG tablet Take 1 tablet (25 mg total) by mouth 3 (three) times daily as needed for dizziness. 08/15/20   Arline Bennett, MD  meclizine  (ANTIVERT ) 25 MG tablet Take 25 mg by  mouth daily as needed.    [provider]  metFORMIN (GLUCOPHAGE) 500 MG tablet Take 500 mg by mouth daily with breakfast. 01/23/18   [provider]  metoCLOPramide  (REGLAN ) 10 MG tablet Take 1 tablet (10 mg total) by mouth every 8 (eight) hours as needed for up to 5 days for nausea or vomiting. 06/16/18 06/21/18  Siadecki, Sebastian, MD  Multiple Vitamin (DAILY VITAMINS) tablet Take 1 tablet by mouth daily. 09/19/06   [provider]  pregabalin (LYRICA) 150 MG capsule Take 150 mg by mouth 3 (three) times daily. 01/20/20   [provider]  sirolimus (RAPAMUNE) 1 MG tablet Take 1 mg by mouth daily.    [provider]  traZODone (DESYREL) 50 MG tablet Take 50 mg by mouth at bedtime.     [provider]  TRESIBA FLEXTOUCH 100 UNIT/ML FlexTouch Pen Inject 15 Units into the skin daily at 10 pm. 01/16/24   [provider]  warfarin (COUMADIN) 3 MG tablet Take by mouth.    [provider]    Family History Family History  Problem Relation Age of Onset   Cancer Mother    Stroke Mother    Heart disease Mother    Heart disease Father     Social History Social History   Tobacco Use   Smoking status: Former   Smokeless tobacco: Never  Advertising account planner   Vaping status: Never Used  Substance Use Topics   Alcohol use: Never   Drug use: Never     Allergies   Patient has no known allergies.   Review of Systems Review of Systems  Constitutional:  Negative for fatigue and fever.  Respiratory:  Negative for shortness of breath.   Cardiovascular:  Positive for leg swelling (chronic bilateral peripheral edema). Negative for chest pain.  Musculoskeletal:  Positive for arthralgias and joint swelling. Negative for gait problem.  Skin:  Positive for color change. Negative for wound.  Neurological:  Negative for weakness and numbness.     Physical Exam Triage Vital Signs ED Triage Vitals  Encounter Vitals Group     BP 01/19/24 1438 130/79     Systolic BP Percentile --      Diastolic BP Percentile --      Pulse Rate 01/19/24 1438 60     Resp 01/19/24 1438 16     Temp 01/19/24 1438 98.7 F (37.1 C)     Temp Source 01/19/24 1438 Oral     SpO2 01/19/24 1438 95 %     Weight 01/19/24 1436 238 lb 15.7 oz (108.4 kg)     Height 01/19/24 1436 5' 11.5" (1.816 m)     Head Circumference --      Peak Flow --      Pain Score 01/19/24 1436 10     Pain Loc --      Pain Education --      Exclude from Growth Chart --    No data found.  Updated Vital Signs BP 130/79 (BP Location: Left Arm)   Pulse 60   Temp 98.7 F (37.1 C) (Oral)   Resp 16   Ht 5' 11.5" (1.816 m)   Wt 238 lb 15.7 oz (108.4 kg)   SpO2 95%   BMI 32.87 kg/m    Physical  Exam Vitals and nursing note reviewed.  Constitutional:      General: He is not in acute distress.    Appearance: Normal appearance. He is well-developed. He is not ill-appearing.  HENT:     Head: Normocephalic and atraumatic.  Eyes:     General: No scleral icterus.    Conjunctiva/sclera: Conjunctivae normal.  Cardiovascular:     Rate and Rhythm: Normal rate and regular rhythm.     Heart sounds: Normal heart sounds.  Pulmonary:     Effort: Pulmonary effort is normal. No respiratory distress.     Breath sounds: Normal breath sounds.  Musculoskeletal:     Cervical back: Neck supple.     Comments: LEFT FOOT: See image  Skin:    General: Skin is warm and dry.     Capillary Refill: Capillary refill takes less than 2 seconds.  Neurological:     General: No focal deficit present.     Mental Status: He is alert. Mental status is at baseline.     Motor: No weakness.     Gait: Gait abnormal (uses cane).  Psychiatric:        Mood and Affect: Mood normal.      UC Treatments / Results  Labs (all labs ordered are listed, but only abnormal results are displayed) Labs Reviewed  URIC ACID    EKG   Radiology US  Venous Img Lower Unilateral Left Result Date: 01/19/2024 CLINICAL DATA:  Left leg pain, swelling EXAM: LEFT LOWER EXTREMITY VENOUS DOPPLER ULTRASOUND TECHNIQUE: Gray-scale sonography with graded compression, as well as color Doppler and duplex ultrasound were performed to evaluate the lower extremity deep venous systems from the level of the common femoral vein and including the common femoral, femoral, profunda femoral, popliteal and calf veins including the posterior tibial, peroneal and gastrocnemius veins when visible. The superficial great saphenous vein was also interrogated. Spectral Doppler was utilized to evaluate flow at rest and with distal augmentation maneuvers in the common femoral, femoral and popliteal veins. COMPARISON:  None Available. FINDINGS: Contralateral Common  Femoral Vein: Respiratory phasicity is normal and symmetric with the symptomatic side. No evidence of thrombus. Normal compressibility. Common Femoral Vein: No evidence of thrombus. Normal compressibility, respiratory phasicity and response to augmentation. Saphenofemoral Junction: No evidence of thrombus. Normal compressibility and flow on color Doppler imaging. Profunda Femoral Vein: No evidence of thrombus. Normal compressibility and flow on color Doppler imaging. Femoral Vein: Partially occlusive thrombus noted in the left femoral vein. Popliteal Vein: Partially occlusive thrombus noted in the left popliteal vein. Calf Veins: No evidence of thrombus. Normal compressibility and flow on color Doppler imaging. Superficial Great Saphenous Vein: No evidence of thrombus. Normal compressibility. Venous Reflux:  None. Other Findings:  None. IMPRESSION: Nonocclusive thrombus within the left femoral vein and popliteal vein. This is age indeterminate. Appearance is suggestive of possibly chronic recanalized thrombus. Electronically Signed   By: Janeece Mechanic M.D.   On: 01/19/2024 17:24    Status  Final [99]   Study Result  Narrative & Impression   PRIOR REPORT IMPORTED FROM AN EXTERNAL SYSTEM    PRIOR REPORT IMPORTED FROM THE [employer] WORKFLOW SYSTEM    REASON FOR EXAM:    swelling left lower leg hx of prior DVT pt currently  on  Coumadin  COMMENTS:    PROCEDURE:     US   - US  DOPPLER LOW EXTR LEFT  - Dec 01 2011 11:11AM    RESULT:    There is observed loss of compressibility of the superficial femoral vein  compatible with nonobstructive venous thrombosis. Doppler examination  shows  the presence of flow within the superficial femoral although distally flow  is markedly diminished and is near  occlusive. No thrombus is identified in  the popliteal. The common femoral and saphenous likewise are patent with  no  thrombus seen.    IMPRESSION:    1.  There is extensive venous thrombosis involving  the superficial femoral  vein. The thrombus is nonocclusive but distally is near occlusive.  2.  The popliteal and common femoral are patent with no thrombus  identified.       Procedures Procedures (including critical care time)  Medications Ordered in UC Medications - No data to display  Initial Impression / Assessment and Plan / UC Course  I have reviewed the triage vital signs and the nursing notes.  Pertinent labs & imaging results that were available during my care of the patient were reviewed by me and considered in my medical decision making (see chart for details).   78 year old male with history of chronic DVT of left lower extremity, long-term anticoagulant therapy, COPD, type 2 diabetes, hypertension, hyperlipidemia, prostate cancer presents for pain, swelling, redness and warmth of left dorsal foot that began today.  No known history of gout.  Upcoming DVT of bilateral lower extremities on 02/20/2024.  Last seen by vascular surgery 2 days ago.  Has IVC filter.  Vital stable and normal.  He is overall well-appearing.  No acute distress.  He walks with a cane.  On exam he has mild to moderate erythema and swelling of the right dorsal medial foot.  Entire dorsal foot is tender to palpation.  Tenderness of medial and lateral malleoli as well as posterior ankle.  No calf tenderness.  No significant swelling of lower extremities at this time and no size discrepancy of lower extremities.  No open wounds or visible injuries.  Will obtain uric acid to assess for possible gout as cause of his symptoms. WNL.  Ordered DVT US  LLE.  US  shows chronic nonocclusive thrombus. Reviewed this with patient and his wife. Advised to continue taking Coumadin and contact vascular specialist on Monday. Report was given to him so he can follow up.   Highest suspicion for gout at this time. Has multiple risk factors. Will treat at this time with prednisone taper. Reviewed RICE guidelines. Reviewed supportive  care and discussed ED precautions.    Final Clinical Impressions(s) / UC Diagnoses   Final diagnoses:  Swelling of left foot  Acute gout of left foot, unspecified cause  Chronic deep vein thrombosis (DVT) of left lower extremity, unspecified vein [facility])   Discharge Instructions   None    ED Prescriptions     Medication Sig Dispense Auth. Provider   predniSONE (DELTASONE) 10 MG tablet Take 6 tabs po on day 1 and decrease by 1 tab daily until complete 21 tablet Floydene Hy, PA-C      I have reviewed the PDMP during this encounter.   Floydene Hy, PA-C 01/19/24 1840

## 2024-01-19 NOTE — ED Triage Notes (Signed)
 Patient c/o left foot pain that started this morning.  Patient denies any injury or fall.  Patient reports redness and swelling on the top of his left foot.  Patient sees Vascular.

## 2024-01-30 ENCOUNTER — Encounter (INDEPENDENT_AMBULATORY_CARE_PROVIDER_SITE_OTHER): Payer: Self-pay | Admitting: Vascular Surgery

## 2024-01-30 NOTE — Progress Notes (Signed)
 Subjective:    Patient ID: Joshua Marshall, male    DOB: 1946-03-10, 78 y.o.   MRN: [id number] Chief Complaint  Patient presents with   New Patient (Initial Visit)    Ref Arabella Knife consult lymphedema    Joshua Marshall is a 78 y.o. male with history of COPD, diabetes, hypertension, liver transplant x 2, prostate cancer, stage III CKD, long-term anticoagulant use, and DVT of left lower extremity.  He presents today for evaluation of bilateral lower extremity swelling.  He denies any pain to his legs either at rest or while ambulating.  He denies any associated erythema and open ulcers of skin.  Denies any injuries or falls. Patient is on Coumadin for management of DVT.  He says he has been on this medication for 20 years.  He reports he checks his INR monthly and it has been within the advised range. Has an IVC filter. He reports no known history of gout.     Review of Systems  Constitutional: Negative.   Gastrointestinal:        Patient HX of Liver transplant   All other systems reviewed and are negative.      Objective:   Physical Exam Constitutional:      Appearance: Normal appearance. He is obese.  HENT:     Head: Normocephalic.   Eyes:     Pupils: Pupils are equal, round, and reactive to light.    Cardiovascular:     Rate and Rhythm: Normal rate and regular rhythm.     Pulses: Normal pulses.     Heart sounds: Normal heart sounds.  Pulmonary:     Effort: Pulmonary effort is normal.     Breath sounds: Normal breath sounds.  Abdominal:     General: Abdomen is flat.     Palpations: Abdomen is soft.   Musculoskeletal:        General: Swelling and tenderness present.     Cervical back: Normal range of motion.     Right lower leg: Edema present.     Left lower leg: Edema present.     Comments: History of left knee pain   Skin:    General: Skin is warm and dry.     Capillary Refill: Capillary refill takes 2 to 3 seconds.   Neurological:     General: No focal deficit  present.     Mental Status: He is alert and oriented to person, place, and time. Mental status is at baseline.   Psychiatric:        Mood and Affect: Mood normal.        Behavior: Behavior normal.        Thought Content: Thought content normal.        Judgment: Judgment normal.     BP 120/75   Pulse 64   Resp 18   Ht 5' 11.5 (1.816 m)   Wt 239 lb (108.4 kg)   BMI 32.87 kg/m   Past Medical History:  Diagnosis Date   COPD (chronic obstructive pulmonary disease) (HCC)    Diabetes mellitus without complication (HCC)    Hypertension    Liver transplanted [facility])     Social History   Socioeconomic History   Marital status: Married    Spouse name: Not on file   Number of children: Not on file   Years of education: Not on file   Highest education level: Not on file  Occupational History   Not on file  Tobacco Use  Smoking status: Former   Smokeless tobacco: Never  Vaping Use   Vaping status: Never Used  Substance and Sexual Activity   Alcohol use: Never   Drug use: Never   Sexual activity: Not on file  Other Topics Concern   Not on file  Social History Narrative   Not on file   Social Drivers of Health   Financial Resource Strain: Not on file  Food Insecurity: Not on file  Transportation Needs: Not on file  Physical Activity: Not on file  Stress: Not on file  Social Connections: Not on file  Intimate Partner Violence: Not on file    Past Surgical History:  Procedure Laterality Date   HERNIA REPAIR     LIVER TRANSPLANT      Family History  Problem Relation Age of Onset   Cancer Mother    Stroke Mother    Heart disease Mother    Heart disease Father     No Known Allergies     Latest Ref Rng & Units 08/14/2020    8:51 PM  CBC  WBC 4.0 - 10.5 K/uL 10.2   Hemoglobin 13.0 - 17.0 g/dL 69.6   Hematocrit 29.5 - 52.0 % 38.2   Platelets 150 - 400 K/uL 210       CMP     Component Value Date/Time   NA 139 08/14/2020 2051   K 4.1 08/14/2020 2051    CL 106 08/14/2020 2051   CO2 21 (L) 08/14/2020 2051   GLUCOSE 162 (H) 08/14/2020 2051   BUN 27 (H) 08/14/2020 2051   CREATININE 1.56 (H) 08/14/2020 2051   CALCIUM 8.8 (L) 08/14/2020 2051   PROT 6.5 08/14/2020 2051   ALBUMIN 3.7 08/14/2020 2051   AST 36 08/14/2020 2051   ALT 42 08/14/2020 2051   ALKPHOS 131 (H) 08/14/2020 2051   BILITOT 0.5 08/14/2020 2051   GFRNONAA 46 (L) 08/14/2020 2051     No results found.     Assessment & Plan:   1. Lymphedema (Primary) Recommend:  I have had a long discussion with the patient regarding swelling and why it  causes symptoms.  Patient will begin wearing graduated compression on a daily basis a prescription was given. The patient will  wear the stockings first thing in the morning and removing them in the evening. The patient is instructed specifically not to sleep in the stockings.   In addition, behavioral modification will be initiated.  This will include frequent elevation, use of over the counter pain medications and exercise such as walking.  Consideration for a lymph pump will also be made based upon the effectiveness of conservative therapy.  This would help to improve the edema control and prevent sequela such as ulcers and infections   Patient should undergo duplex ultrasound of the venous system to ensure that DVT or reflux is not present.  The patient will follow-up with me after the ultrasound.   2. Hypertension, unspecified type Continue antihypertensive medications as already ordered, these medications have been reviewed and there are no changes at this time.  3. Type 2 diabetes mellitus without complication, without long-term current use of insulin (HCC) Continue hypoglycemic medications as already ordered, these medications have been reviewed and there are no changes at this time.  Hgb A1C to be monitored as already arranged by primary service   Current Outpatient Medications on File Prior to Visit  Medication Sig  Dispense Refill   amoxicillin-clavulanate (AUGMENTIN) 875-125 MG tablet Take 1 tablet by mouth  2 (two) times daily.     atorvastatin (LIPITOR) 80 MG tablet Take 80 mg by mouth daily.     Calcium Carbonate Antacid (CALCIUM CARBONATE PO) Take 750 mg by mouth 3 (three) times daily.     clonazePAM (KLONOPIN) 0.5 MG tablet Take 1 mg by mouth 2 (two) times daily.     escitalopram (LEXAPRO) 20 MG tablet Take 20 mg by mouth daily.     FARXIGA 10 MG TABS tablet Take 10 mg by mouth as directed. 1 tablet Mon-Sat     fluticasone-salmeterol (ADVAIR) 250-50 MCG/ACT AEPB Inhale 1 puff into the lungs in the morning and at bedtime.     furosemide (LASIX) 40 MG tablet Take 40 mg by mouth daily.     GEMTESA 75 MG TABS Take by mouth daily.     glipiZIDE (GLUCOTROL XL) 5 MG 24 hr tablet Take 5 mg by mouth daily with breakfast.     lansoprazole (PREVACID) 30 MG capsule Take 30 mg by mouth daily.     loratadine (CLARITIN) 10 MG tablet Take 10 mg by mouth daily.     meclizine  (ANTIVERT ) 25 MG tablet Take 1 tablet (25 mg total) by mouth 3 (three) times daily as needed for dizziness. 30 tablet 0   meclizine  (ANTIVERT ) 25 MG tablet Take 25 mg by mouth daily as needed.     metFORMIN (GLUCOPHAGE) 500 MG tablet Take 500 mg by mouth daily with breakfast.     Multiple Vitamin (DAILY VITAMINS) tablet Take 1 tablet by mouth daily.     pregabalin (LYRICA) 150 MG capsule Take 150 mg by mouth 3 (three) times daily.     sirolimus (RAPAMUNE) 1 MG tablet Take 1 mg by mouth daily.     traZODone (DESYREL) 50 MG tablet Take 50 mg by mouth at bedtime.     TRESIBA FLEXTOUCH 100 UNIT/ML FlexTouch Pen Inject 15 Units into the skin daily at 10 pm.     warfarin (COUMADIN) 3 MG tablet Take by mouth.     metoCLOPramide  (REGLAN ) 10 MG tablet Take 1 tablet (10 mg total) by mouth every 8 (eight) hours as needed for up to 5 days for nausea or vomiting. 12 tablet 0   No current facility-administered medications on file prior to visit.    There  are no Patient Instructions on file for this visit. No follow-ups on file.   Annamaria Barrette, NP

## 2024-02-19 ENCOUNTER — Other Ambulatory Visit (INDEPENDENT_AMBULATORY_CARE_PROVIDER_SITE_OTHER): Payer: Self-pay | Admitting: Vascular Surgery

## 2024-02-19 DIAGNOSIS — M7989 Other specified soft tissue disorders: Secondary | ICD-10-CM

## 2024-02-19 DIAGNOSIS — I8002 Phlebitis and thrombophlebitis of superficial vessels of left lower extremity: Secondary | ICD-10-CM

## 2024-02-20 ENCOUNTER — Other Ambulatory Visit (INDEPENDENT_AMBULATORY_CARE_PROVIDER_SITE_OTHER)

## 2024-02-20 ENCOUNTER — Encounter (INDEPENDENT_AMBULATORY_CARE_PROVIDER_SITE_OTHER)

## 2024-02-20 ENCOUNTER — Encounter (INDEPENDENT_AMBULATORY_CARE_PROVIDER_SITE_OTHER): Payer: Self-pay | Admitting: Vascular Surgery

## 2024-02-20 ENCOUNTER — Ambulatory Visit (INDEPENDENT_AMBULATORY_CARE_PROVIDER_SITE_OTHER): Admitting: Vascular Surgery

## 2024-02-20 VITALS — BP 112/73 | HR 65 | Ht 71.5 in | Wt 237.5 lb

## 2024-02-20 DIAGNOSIS — E119 Type 2 diabetes mellitus without complications: Secondary | ICD-10-CM | POA: Diagnosis not present

## 2024-02-20 DIAGNOSIS — I890 Lymphedema, not elsewhere classified: Secondary | ICD-10-CM

## 2024-02-20 DIAGNOSIS — I10 Essential (primary) hypertension: Secondary | ICD-10-CM

## 2024-02-20 DIAGNOSIS — M7989 Other specified soft tissue disorders: Secondary | ICD-10-CM | POA: Diagnosis not present

## 2024-02-20 DIAGNOSIS — I8002 Phlebitis and thrombophlebitis of superficial vessels of left lower extremity: Secondary | ICD-10-CM | POA: Diagnosis not present

## 2024-02-20 NOTE — Progress Notes (Signed)
 Subjective:    Patient ID: Joshua Marshall, male    DOB: 1946/02/16, 78 y.o.   MRN: [id number] Chief Complaint  Patient presents with   fu pt conv. Reflux.     Joshua Marshall is a 78 yo male who presents to clinic in follow-up with vascular venous ultrasounds completed today.  Patient's vascular venous ultrasounds are negative for any reflux or DVTs.  Therefore his bilateral lower extremity swelling most likely related to lymphedema.  Patient has started conventional therapy.  He is wearing compression socks today.  Patient endorses that his left leg feels less swollen than it normally has been.  I encouraged him to continue to do so and reminded him that changes will come overnight this may take a while but keep working at it.  He endorses he is not doing well in terms of getting off his feet and elevating his legs when he can.  Overall he endorses he feels like he is moving in the right direction.    Review of Systems  Constitutional: Negative.   Cardiovascular:  Positive for leg swelling.  All other systems reviewed and are negative.      Objective:   Physical Exam Vitals reviewed.  Constitutional:      Appearance: Normal appearance. He is normal weight.  HENT:     Head: Normocephalic.  Eyes:     Pupils: Pupils are equal, round, and reactive to light.  Cardiovascular:     Rate and Rhythm: Normal rate. Rhythm irregular.     Pulses: Normal pulses.     Heart sounds: Normal heart sounds.  Pulmonary:     Effort: Pulmonary effort is normal.     Breath sounds: Normal breath sounds.     Comments: Respiratory Distress when trying to ambulate.  Abdominal:     General: Abdomen is flat. Bowel sounds are normal.     Palpations: Abdomen is soft.     Comments: Abdominal Lymphedema  Musculoskeletal:        General: Swelling present.     Right lower leg: Edema present.     Left lower leg: Edema present.  Skin:    General: Skin is warm and dry.     Capillary Refill: Capillary  refill takes 2 to 3 seconds.  Neurological:     General: No focal deficit present.     Mental Status: He is alert and oriented to person, place, and time. Mental status is at baseline.  Psychiatric:        Mood and Affect: Mood normal.        Behavior: Behavior normal.        Thought Content: Thought content normal.        Judgment: Judgment normal.     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[facility])     Social History   Socioeconomic History   Marital status: Married    Spouse name: Not on file   Number of children: Not on file   Years of education: Not on file   Highest education level: Not on file  Occupational History   Not on file  Tobacco Use   Smoking status: Former   Smokeless tobacco:  Never  Vaping Use   Vaping status: Never Used  Substance and Sexual Activity   Alcohol use: Never   Drug use: Never   Sexual activity: Not on file  Other Topics Concern   Not on file  Social History Narrative   Not on file   Social Drivers of Health   Financial Resource Strain: Not on file  Food Insecurity: Not on file  Transportation Needs: Not on file  Physical Activity: Not on file  Stress: Not on file  Social Connections: Not on file  Intimate Partner Violence: Not on file    Past Surgical History:  Procedure Laterality Date   HERNIA REPAIR     LIVER TRANSPLANT      Family History  Problem Relation Age of Onset   Cancer Mother    Stroke Mother    Heart disease Mother    Heart disease Father     No Known Allergies     Latest Ref Rng & Units 08/14/2020    8:51 PM  CBC  WBC 4.0 - 10.5 K/uL 10.2   Hemoglobin 13.0 - 17.0 g/dL 86.8   Hematocrit 60.9 - 52.0 % 38.2   Platelets 150 - 400 K/uL 210       CMP      Component Value Date/Time   NA 139 08/14/2020 2051   K 4.1 08/14/2020 2051   CL 106 08/14/2020 2051   CO2 21 (L) 08/14/2020 2051   GLUCOSE 162 (H) 08/14/2020 2051   BUN 27 (H) 08/14/2020 2051   CREATININE 1.56 (H) 08/14/2020 2051   CALCIUM 8.8 (L) 08/14/2020 2051   PROT 6.5 08/14/2020 2051   ALBUMIN 3.7 08/14/2020 2051   AST 36 08/14/2020 2051   ALT 42 08/14/2020 2051   ALKPHOS 131 (H) 08/14/2020 2051   BILITOT 0.5 08/14/2020 2051   GFRNONAA 46 (L) 08/14/2020 2051     No results found.     Assessment & Plan:   1. Lymphedema (Primary) Patient presents to clinic today in follow-up from initial visit for bilateral lower leg swelling/lymphedema.  Patient started wearing knee-high's compression socks.  They are not medical grade so not sure if they are working well but patient endorses that his left leg does feel a little less swollen since he started to wear it.  I discussed in detail his vascular ultrasounds today.  He has no reflux and no DVTs in both lower extremities.  Therefore he does not have any restriction to any blood flow that would create his swelling other than lymphedema.  I reiterated again rest, elevation, exercise and compression.  Patient states he is not doing well elevating but we will try to add that to his regime to help alleviate some of his swelling.  Plan is to see him back in 6 months after he wears medical grade compression to see how his lymphedema is doing.  No studies ordered on follow-up.  2. Hypertension, unspecified type Continue antihypertensive medications as already ordered, these medications have been reviewed and there are no changes at this time.  3. Type 2 diabetes mellitus without complication, without long-term current use of insulin (HCC) Continue hypoglycemic medications as already ordered, these medications have been reviewed and there are no changes at this time.  Hgb A1C to be monitored as already arranged by primary service   Current  Outpatient Medications on File Prior to Visit  Medication Sig Dispense Refill   amoxicillin-clavulanate (AUGMENTIN) 875-125 MG tablet Take 1 tablet by mouth 2 (two)  times daily.     atorvastatin (LIPITOR) 80 MG tablet Take 80 mg by mouth daily.     Calcium Carbonate Antacid (CALCIUM CARBONATE PO) Take 750 mg by mouth 3 (three) times daily.     clonazePAM (KLONOPIN) 0.5 MG tablet Take 1 mg by mouth 2 (two) times daily.     escitalopram (LEXAPRO) 20 MG tablet Take 20 mg by mouth daily.     FARXIGA 10 MG TABS tablet Take 10 mg by mouth as directed. 1 tablet Mon-Sat     fluticasone-salmeterol (ADVAIR) 250-50 MCG/ACT AEPB Inhale 1 puff into the lungs in the morning and at bedtime.     furosemide (LASIX) 40 MG tablet Take 40 mg by mouth daily.     GEMTESA 75 MG TABS Take by mouth daily.     glipiZIDE (GLUCOTROL XL) 5 MG 24 hr tablet Take 5 mg by mouth daily with breakfast.     lansoprazole (PREVACID) 30 MG capsule Take 30 mg by mouth daily.     loratadine (CLARITIN) 10 MG tablet Take 10 mg by mouth daily.     meclizine  (ANTIVERT ) 25 MG tablet Take 1 tablet (25 mg total) by mouth 3 (three) times daily as needed for dizziness. 30 tablet 0   meclizine  (ANTIVERT ) 25 MG tablet Take 25 mg by mouth daily as needed.     metFORMIN (GLUCOPHAGE) 500 MG tablet Take 500 mg by mouth daily with breakfast.     metoCLOPramide  (REGLAN ) 10 MG tablet Take 1 tablet (10 mg total) by mouth every 8 (eight) hours as needed for up to 5 days for nausea or vomiting. 12 tablet 0   Multiple Vitamin (DAILY VITAMINS) tablet Take 1 tablet by mouth daily.     predniSONE  (DELTASONE ) 10 MG tablet Take 6 tabs po on day 1 and decrease by 1 tab daily until complete 21 tablet 0   pregabalin (LYRICA) 150 MG capsule Take 150 mg by mouth 3 (three) times daily.     sirolimus (RAPAMUNE) 1 MG tablet Take 1 mg by mouth daily.     traZODone (DESYREL) 50 MG tablet Take 50 mg by mouth at bedtime.     TRESIBA FLEXTOUCH 100 UNIT/ML FlexTouch Pen  Inject 15 Units into the skin daily at 10 pm.     warfarin (COUMADIN) 3 MG tablet Take by mouth.     No current facility-administered medications on file prior to visit.    There are no Patient Instructions on file for this visit. No follow-ups on file.   Gwendlyn JONELLE Shank, NP

## 2024-08-22 ENCOUNTER — Ambulatory Visit (INDEPENDENT_AMBULATORY_CARE_PROVIDER_SITE_OTHER): Admitting: Vascular Surgery
